# Patient Record
Sex: Male | Born: 1995 | Hispanic: Yes | Marital: Single | Smoking: Never smoker
Health system: Southern US, Community
[De-identification: ages and names within clinical notes are randomized; demographics above are authoritative.]

## PROBLEM LIST (undated history)

## (undated) DIAGNOSIS — J45990 Exercise induced bronchospasm: Secondary | ICD-10-CM

## (undated) DIAGNOSIS — J301 Allergic rhinitis due to pollen: Secondary | ICD-10-CM

## (undated) HISTORY — DX: Allergic rhinitis due to pollen: J30.1

## (undated) HISTORY — DX: Exercise induced bronchospasm: J45.990

---
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
## 2015-03-01 ENCOUNTER — Encounter: Payer: Self-pay | Admitting: Internal Medicine

## 2015-03-01 ENCOUNTER — Ambulatory Visit (INDEPENDENT_AMBULATORY_CARE_PROVIDER_SITE_OTHER): Payer: BLUE CROSS/BLUE SHIELD | Admitting: Internal Medicine

## 2015-03-01 ENCOUNTER — Encounter (INDEPENDENT_AMBULATORY_CARE_PROVIDER_SITE_OTHER): Payer: Self-pay

## 2015-03-01 VITALS — BP 110/80 | HR 97 | Temp 98.0°F | Ht 66.0 in | Wt 182.0 lb

## 2015-03-01 DIAGNOSIS — J301 Allergic rhinitis due to pollen: Secondary | ICD-10-CM

## 2015-03-01 DIAGNOSIS — Z0000 Encounter for general adult medical examination without abnormal findings: Secondary | ICD-10-CM | POA: Diagnosis not present

## 2015-03-01 DIAGNOSIS — J45990 Exercise induced bronchospasm: Secondary | ICD-10-CM | POA: Diagnosis not present

## 2015-03-01 MED ORDER — ALBUTEROL SULFATE HFA 108 (90 BASE) MCG/ACT IN AERS: 2.0000 | INHALATION_SPRAY | Freq: Three times a day (TID) | RESPIRATORY_TRACT | Status: AC | PRN

## 2015-03-01 NOTE — Progress Notes (Signed)
Subjective:    Patient ID: Peter Crawford, male    DOB: 09/19/1995, 20 y.o.   MRN: [id number]  HPI Here for physical  Has noticed a frequent cough--since "cold season" started Known exercise induced asthma Ran out of inhaler--feels he needs it again with soccer again Some SOB--not sure if he is just out of shape Some wheezing after running for a while  Still at [employer] and spanish Lives at home   No current outpatient prescriptions on file prior to visit.   No current facility-administered medications on file prior to visit.    No Known Allergies  Past Medical History  Diagnosis Date  . Exercise-induced asthma     No past surgical history on file.  Family History  Problem Relation Age of Onset  . Diabetes Father   . Asthma Brother   . Cancer Maternal Grandmother     metastatic cancer  . Heart disease Neg Hx   . Hypertension Other   . Diabetes Paternal Grandfather     Social History   Social History  . Marital Status: Single    Spouse Name: N/A  . Number of Children: N/A  . Years of Education: N/A   Occupational History  . Not on file.   Social History Main Topics  . Smoking status: Never Smoker   . Smokeless tobacco: Never Used  . Alcohol Use: No  . Drug Use: No  . Sexual Activity: Not on file   Other Topics Concern  . 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        Keeps up with dentist  Eyes: Negative for visual disturbance.       No diplopia or unilateral vision loss  Respiratory: Positive for cough and shortness of breath. Negative for chest tightness.   Cardiovascular: Negative for chest pain, palpitations and leg swelling.  Gastrointestinal: Negative for nausea, vomiting,  abdominal pain, constipation and blood in stool.  Endocrine: Negative for polydipsia and polyuria.  Genitourinary: Negative for dysuria, urgency and difficulty urinating.  Musculoskeletal: Negative for back pain and joint swelling.       Shoulder is better   Skin: Negative for rash.  Allergic/Immunologic: Positive for environmental allergies. Negative for immunocompromised state.       Uses allegra or mucinex May trigger asthma in spring  Neurological: Negative for dizziness, syncope, weakness, light-headedness and headaches.  Hematological: Negative for adenopathy. Does not bruise/bleed easily.  Psychiatric/Behavioral: Negative for sleep disturbance and dysphoric mood. The patient is not nervous/anxious.        Upset about GM with cancer and declining status       Objective:   Physical Exam  Constitutional: He is oriented to person, place, and time. He appears well-developed and well-nourished. No distress.  HENT:  Head: Normocephalic and atraumatic.  Right Ear: External ear normal.  Left Ear: External ear normal.  Mouth/Throat: Oropharynx is clear and moist. No oropharyngeal exudate.  Eyes: Conjunctivae and EOM are normal. Pupils are equal, round, and reactive to light.  Neck: Normal range of motion. Neck supple. No thyromegaly present.  Cardiovascular: Normal rate, regular rhythm, normal heart sounds and intact distal pulses.  Exam reveals no gallop.   No murmur heard. Pulmonary/Chest:  Effort normal. No respiratory distress. He has no wheezes. He has no rales.  Slight squeaks in right lung but not tight or wheezy  Abdominal: Soft. He exhibits no distension. There is no tenderness. There is no rebound and no guarding.  Genitourinary:  Tanner 5 Normal testes  Musculoskeletal: He exhibits no edema or tenderness.  Lymphadenopathy:    He has no cervical adenopathy.  Neurological: He is alert and oriented to person, place, and time.  Skin: No erythema.  Psychiatric: He has a  normal mood and affect. His behavior is normal.          Assessment & Plan:

## 2015-03-01 NOTE — Progress Notes (Signed)
Pre visit review using our clinic review tool, if applicable. No additional management support is needed unless otherwise documented below in the visit note. 

## 2015-03-01 NOTE — Assessment & Plan Note (Signed)
May want to consider montelukast if worsens in season

## 2015-03-01 NOTE — Assessment & Plan Note (Signed)
Healthy but out of shape Working on fitness Counseled on safety in car, substances (no tobacco or alcohol), safe sex Prefers no HIV testing

## 2015-03-01 NOTE — Assessment & Plan Note (Signed)
Will Rx inhaler

## 2015-04-02 ENCOUNTER — Other Ambulatory Visit: Payer: Self-pay | Admitting: Family Medicine

## 2015-04-02 MED ORDER — OSELTAMIVIR PHOSPHATE 75 MG PO CAPS: 75.0000 mg | ORAL_CAPSULE | Freq: Every day | ORAL | Status: DC

## 2016-03-12 ENCOUNTER — Encounter: Payer: Self-pay | Admitting: Family Medicine

## 2016-03-12 ENCOUNTER — Ambulatory Visit (INDEPENDENT_AMBULATORY_CARE_PROVIDER_SITE_OTHER): Payer: BLUE CROSS/BLUE SHIELD | Admitting: Family Medicine

## 2016-03-12 ENCOUNTER — Encounter (INDEPENDENT_AMBULATORY_CARE_PROVIDER_SITE_OTHER): Payer: Self-pay

## 2016-03-12 VITALS — BP 90/60 | HR 112 | Temp 98.9°F | Wt 178.0 lb

## 2016-03-12 DIAGNOSIS — R8290 Unspecified abnormal findings in urine: Secondary | ICD-10-CM | POA: Diagnosis not present

## 2016-03-12 DIAGNOSIS — K529 Noninfective gastroenteritis and colitis, unspecified: Secondary | ICD-10-CM

## 2016-03-12 DIAGNOSIS — K5289 Other specified noninfective gastroenteritis and colitis: Secondary | ICD-10-CM | POA: Diagnosis not present

## 2016-03-12 NOTE — Progress Notes (Signed)
Recently with sx.  Started last night.  Felt nauseated.  Then vomiting and  progressive diarrhea.  Went to [facility] this AM.  Occ lightheaded on standing.  Still making urine, but darker than normal.  Fever this AM.  Prev with aches.    [facility]  D/w pt.  No burning with urination.  No h/o renal stones.    He feels better after taking zofran and taking imodium.    Meds, vitals, and allergies reviewed.   ROS: Per HPI unless specifically indicated in ROS section   GEN: nad, alert and oriented HEENT: mucous membranes moist NECK: supple w/o LA CV: rrr. PULM: ctab, no inc wob ABD: soft, +bs, no rebound EXT: no edema

## 2016-03-12 NOTE — Progress Notes (Signed)
Pre visit review using our clinic review tool, if applicable. No additional management support is needed unless otherwise documented below in the visit note. 

## 2016-03-12 NOTE — Patient Instructions (Signed)
Rest and clear fluids, should gradually resolve.  Update us as needed.  Get up slowly- your BP was a little low.  Take care.  Glad to see you.

## 2016-03-13 ENCOUNTER — Telehealth: Payer: Self-pay | Admitting: Family Medicine

## 2016-03-13 DIAGNOSIS — K529 Noninfective gastroenteritis and colitis, unspecified: Secondary | ICD-10-CM | POA: Insufficient documentation

## 2016-03-13 NOTE — Telephone Encounter (Signed)
Spoke to pt

## 2016-03-13 NOTE — Assessment & Plan Note (Signed)
Presumed norovirus.  Rest and fluids.  Avoid dairy.  zofran if needed.   Okay for outpatient f/u.  Routine cautions given to patient.   He had abnormal u/a at [facility] this AM, unclear if that was related to relative dehydration vs false positive.  D/w pt.  Would recheck u/a after he is well, unless PCP has other preferences- routed phone note to PCP for input.  No dysuria.

## 2016-03-13 NOTE — Telephone Encounter (Signed)
Please let him know that the abnormalities appear to be from dehydration and UTI very unlikely

## 2016-03-13 NOTE — Telephone Encounter (Signed)
  [facility] unclear if that was related to relative dehydration vs false positive.  UC is culturing his urine.  I would recheck u/a after he is well, unless you have other preferences.   No dysuria.  Let me know.  Thanks.

## 2016-06-06 ENCOUNTER — Ambulatory Visit (INDEPENDENT_AMBULATORY_CARE_PROVIDER_SITE_OTHER): Payer: BLUE CROSS/BLUE SHIELD | Admitting: Family Medicine

## 2016-06-06 ENCOUNTER — Encounter: Payer: Self-pay | Admitting: Family Medicine

## 2016-06-06 DIAGNOSIS — J4521 Mild intermittent asthma with (acute) exacerbation: Secondary | ICD-10-CM

## 2016-06-06 DIAGNOSIS — IMO0001 Reserved for inherently not codable concepts without codable children: Secondary | ICD-10-CM

## 2016-06-06 MED ORDER — BECLOMETHASONE DIPROPIONATE 80 MCG/ACT IN AERS: 2.0000 | INHALATION_SPRAY | Freq: Two times a day (BID) | RESPIRATORY_TRACT | 12 refills | Status: DC

## 2016-06-06 MED ORDER — MONTELUKAST SODIUM 10 MG PO TABS: 10.0000 mg | ORAL_TABLET | Freq: Every day | ORAL | 3 refills | Status: DC

## 2016-06-06 NOTE — Patient Instructions (Signed)
Associated with allergies.  Start singulair in addition to [employer].  Start inhaled corticosteroid , QVAR moderate dose. Follow up in 1 months to reassess control.

## 2016-06-06 NOTE — Assessment & Plan Note (Signed)
Associated with allergies.  Start singulair in addition to [employer].  Start inhaled corticosteroid , QVAR moderate dose. Follow up in 1 months to reassess control.   No indication to prednisone today given pealk flow 500.

## 2016-06-06 NOTE — Progress Notes (Signed)
   Subjective:    Patient ID: Peter Crawford, male    DOB: 1995-12-13, 21 y.o.   MRN: [id number]  HPI   21 year old male pt of Dr. Karle Starch presents for allergies and asthma.  ASTHMA , previously exercise induced in past Symptoms are well controlled:NO , worsened control in last month  Using medications : Using albuterol every 8-12 hours for symptoms Night time symptoms: yes, cough Wheeze/SOB: yes when outside.. Chest tightness, SOB Has had asthma attack in last week.  dry cough ER visits since last visit: none Missed work or school: none Allergens:   Has runny nose, itchy eye, irritatted eyes  Currently using allegra D once daily.   Review of Systems  Constitutional: Positive for fatigue.  HENT: Negative for ear pain.   Eyes: Negative for pain.  Respiratory: Positive for cough, chest tightness, shortness of breath and wheezing.   Cardiovascular: Negative for chest pain, palpitations and leg swelling.  Gastrointestinal: Negative for abdominal distention.       Objective:   Physical Exam  Constitutional: Vital signs are normal. He appears well-developed and well-nourished.  Non-toxic appearance. He does not appear ill. No distress.  HENT:  Head: Normocephalic and atraumatic.  Right Ear: Hearing, tympanic membrane, external ear and ear canal normal. No tenderness. No foreign bodies. Tympanic membrane is not retracted and not bulging.  Left Ear: Hearing, tympanic membrane, external ear and ear canal normal. No tenderness. No foreign bodies. Tympanic membrane is not retracted and not bulging.  Nose: Mucosal edema and rhinorrhea present. Right sinus exhibits no maxillary sinus tenderness and no frontal sinus tenderness. Left sinus exhibits no maxillary sinus tenderness and no frontal sinus tenderness.  Mouth/Throat: Uvula is midline, oropharynx is clear and moist and mucous membranes are normal. Normal dentition. No dental caries. No oropharyngeal exudate or tonsillar abscesses.    Eyes: Conjunctivae, EOM and lids are normal. Pupils are equal, round, and reactive to light. Lids are everted and swept, no foreign bodies found.  Neck: Trachea normal, normal range of motion and phonation normal. Neck supple. Carotid bruit is not present. No thyroid mass and no thyromegaly present.  Cardiovascular: Normal rate, regular rhythm, S1 normal, S2 normal, normal heart sounds, intact distal pulses and normal pulses.  Exam reveals no gallop.   No murmur heard. Pulmonary/Chest: Effort normal. No respiratory distress. He has wheezes. He has no rhonchi. He has no rales.  Abdominal: Soft. Normal appearance and bowel sounds are normal. There is no hepatosplenomegaly. There is no tenderness. There is no rebound, no guarding and no CVA tenderness. No hernia.  Neurological: He is alert. He has normal reflexes.  Skin: Skin is warm, dry and intact. No rash noted.  Psychiatric: He has a normal mood and affect. His speech is normal and behavior is normal. Judgment normal.          Assessment & Plan:

## 2016-06-06 NOTE — Progress Notes (Signed)
Pre visit review using our clinic review tool, if applicable. No additional management support is needed unless otherwise documented below in the visit note. 

## 2016-08-12 ENCOUNTER — Ambulatory Visit (INDEPENDENT_AMBULATORY_CARE_PROVIDER_SITE_OTHER): Payer: BLUE CROSS/BLUE SHIELD | Admitting: Primary Care

## 2016-08-12 ENCOUNTER — Ambulatory Visit (INDEPENDENT_AMBULATORY_CARE_PROVIDER_SITE_OTHER)
Admission: RE | Admit: 2016-08-12 | Discharge: 2016-08-12 | Disposition: A | Discharge status: Home / Self Care | Payer: BLUE CROSS/BLUE SHIELD | Source: Ambulatory Visit | Attending: Primary Care | Admitting: Primary Care

## 2016-08-12 VITALS — BP 122/82 | HR 66 | Temp 98.4°F | Wt 182.8 lb

## 2016-08-12 DIAGNOSIS — M25531 Pain in right wrist: Secondary | ICD-10-CM

## 2016-08-12 DIAGNOSIS — R29898 Other symptoms and signs involving the musculoskeletal system: Secondary | ICD-10-CM | POA: Diagnosis not present

## 2016-08-12 NOTE — Patient Instructions (Addendum)
Complete xray(s) prior to leaving today.   Start Ibuprofen 600 mg three times daily for pain and inflammation.  Continue wearing the brace for support as discussed.  I'll be in touch later today or tomorrow.  It was a pleasure meeting you!

## 2016-08-12 NOTE — Progress Notes (Signed)
Subjective:    Patient ID: Peter Crawford, male    DOB: 1995-11-24, 21 y.o.   MRN: [id number]  HPI  Peter Crawford is a 21 year old male who presents today with a chief complaint of wrist pain. His pain began after injury 1 week ago. He was playing soccer, slipped with another player, fell onto an outstretched hand. He suddenly felt a "pop" and noticed swelling with limited range of movement and pain. Since the injury he's continued to notice pain with various ranges of motion and when lifting objects. He notices intermittent swelling. He's been wearing the wrist splint during the day and at night. He's not taken anything OTC for his symptoms.   Review of Systems  Constitutional: Negative for fever.  Musculoskeletal: Positive for arthralgias and joint swelling.  Skin: Negative for color change.  Neurological: Negative for numbness.       Past Medical History:  Diagnosis Date  . Allergic rhinitis due to pollen    can trigger asthma  . Exercise-induced asthma      Social History   Social History  . Marital status: Single    Spouse name: N/A  . Number of children: N/A  . Years of education: N/A   Occupational History  . Not on file.   Social History Main Topics  . Smoking status: Never Smoker  . Smokeless tobacco: Never Used  . Alcohol use No  . Drug use: No  . Sexual activity: Not on file   Other Topics Concern  . 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  Family History  Problem Relation Age of Onset  . Diabetes Father   . Asthma Brother   . Cancer Maternal Grandmother        metastatic cancer  . Heart disease Neg Hx   . Hypertension Other   . Diabetes Paternal Grandfather     No Known Allergies  Current Outpatient Prescriptions on File Prior to Visit  Medication Sig Dispense Refill  . albuterol (PROVENTIL HFA;VENTOLIN HFA) 108 (90 Base)  MCG/ACT inhaler Inhale 2 puffs into the lungs 3 (three) times daily as needed for wheezing or shortness of breath. Use with spacer 18 g 3  . beclomethasone (QVAR) 80 MCG/ACT inhaler Inhale 2 puffs into the lungs 2 (two) times daily. 1 Inhaler 12  . fexofenadine-pseudoephedrine (ALLEGRA-D 24) 180-240 MG 24 hr tablet Take 1 tablet by mouth daily.    . montelukast (SINGULAIR) 10 MG tablet Take 1 tablet (10 mg total) by mouth at bedtime. 30 tablet 3   No current facility-administered medications on file prior to visit.     BP 122/82   Pulse 66   Temp 98.4 F (36.9 C) (Oral)   Wt 182 lb 12.8 oz (82.9 kg)   SpO2 98%   BMI 29.06 kg/m    Objective:   Physical Exam  Constitutional: He appears well-nourished.  Neck: Neck supple.  Cardiovascular: Normal rate.   Pulmonary/Chest: Effort normal.  Musculoskeletal:       Right wrist: He exhibits decreased range of motion, tenderness, bony tenderness and swelling. He exhibits no deformity.  Skin: Skin is warm and dry. No erythema.          Assessment & Plan:  Acute Wrist Pain:  Located to right wrist x 1 week ago after FOOSH. Exam today with mild swelling, decrease in ROM, and  tenderness. Xray of wrist pending today.  Start Ibuprofen 600 mg TID for pain and inflammation. Discussed to continue wearing the brace for support during the day and when sleeping. May need ortho evaluation if fractured.  Morrie Sheldonlark,Ebonique Hallstrom Kendal, NP

## 2016-11-04 IMAGING — CR DG SHOULDER 2+V*R*
3 series · 3 of 3 positions shown · non-contrast
Comparison: None.

CLINICAL DATA: Intermittent right shoulder pain.

EXAM:
RIGHT SHOULDER - 2+ VIEW

[view not recorded (1 of 3)]
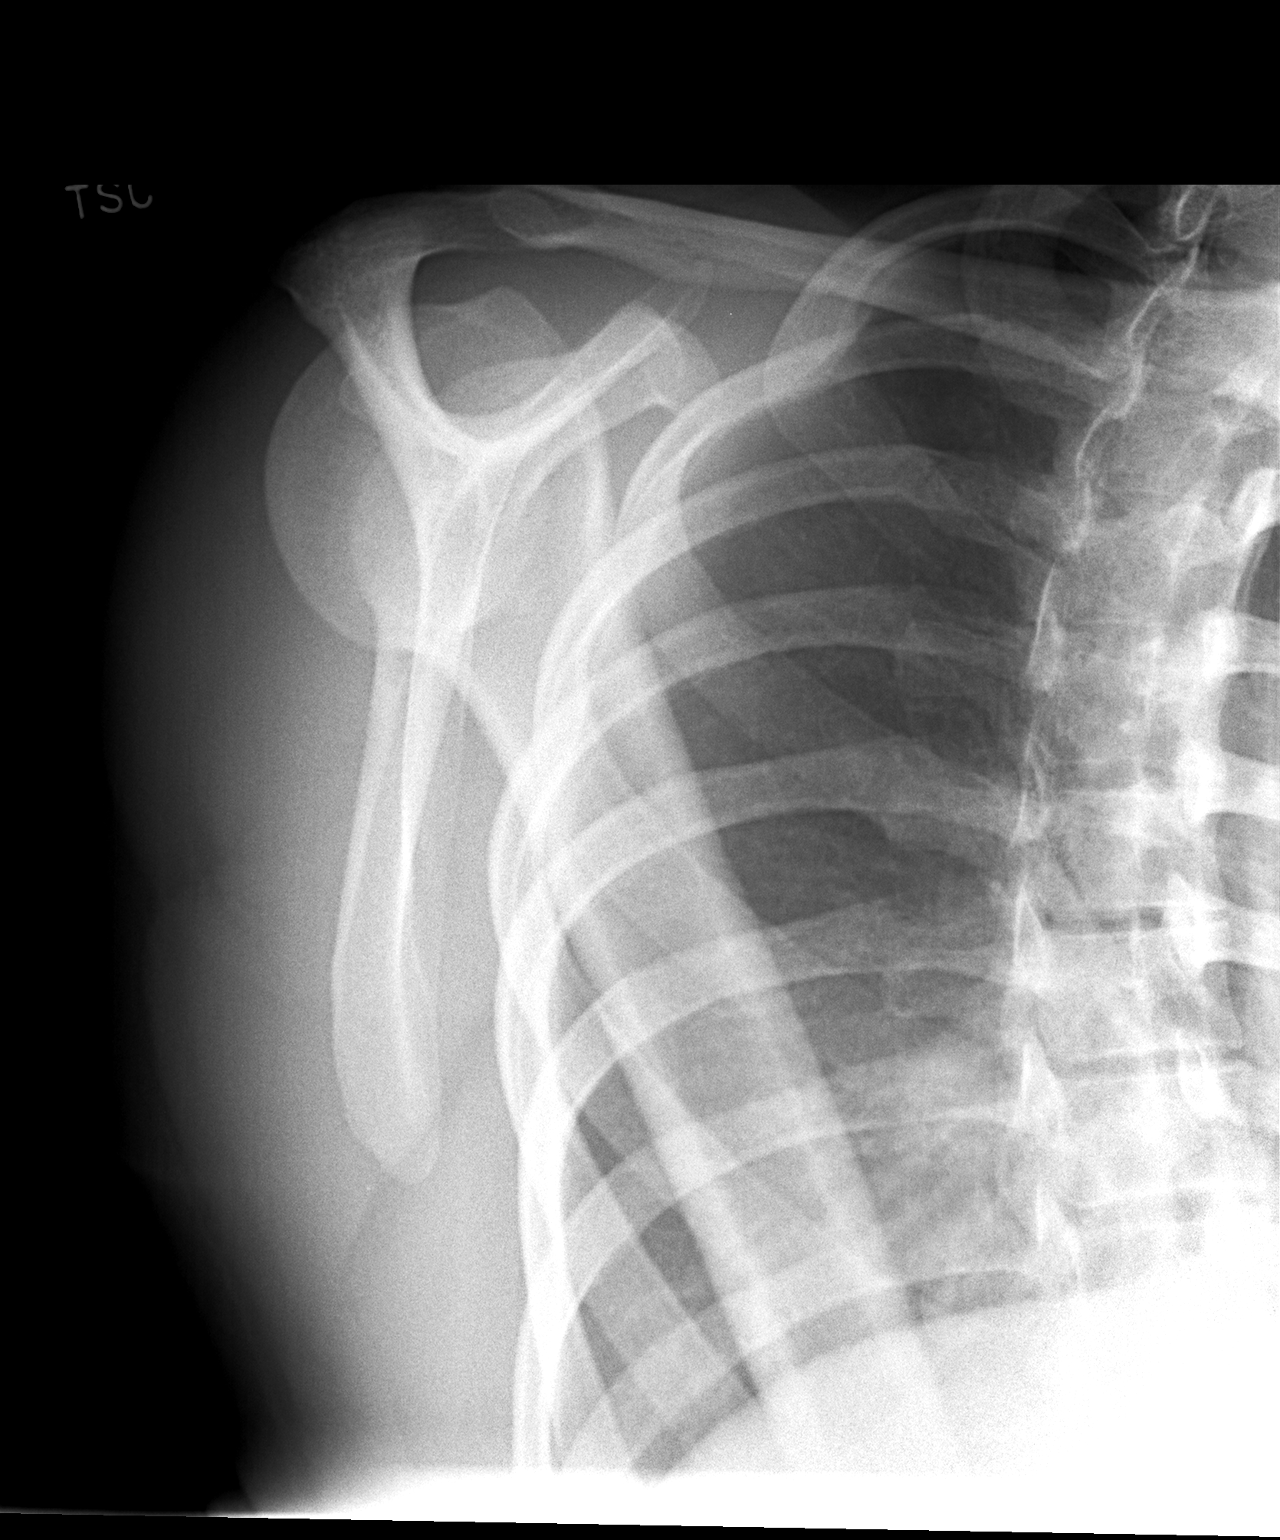

[view not recorded (2 of 3)]
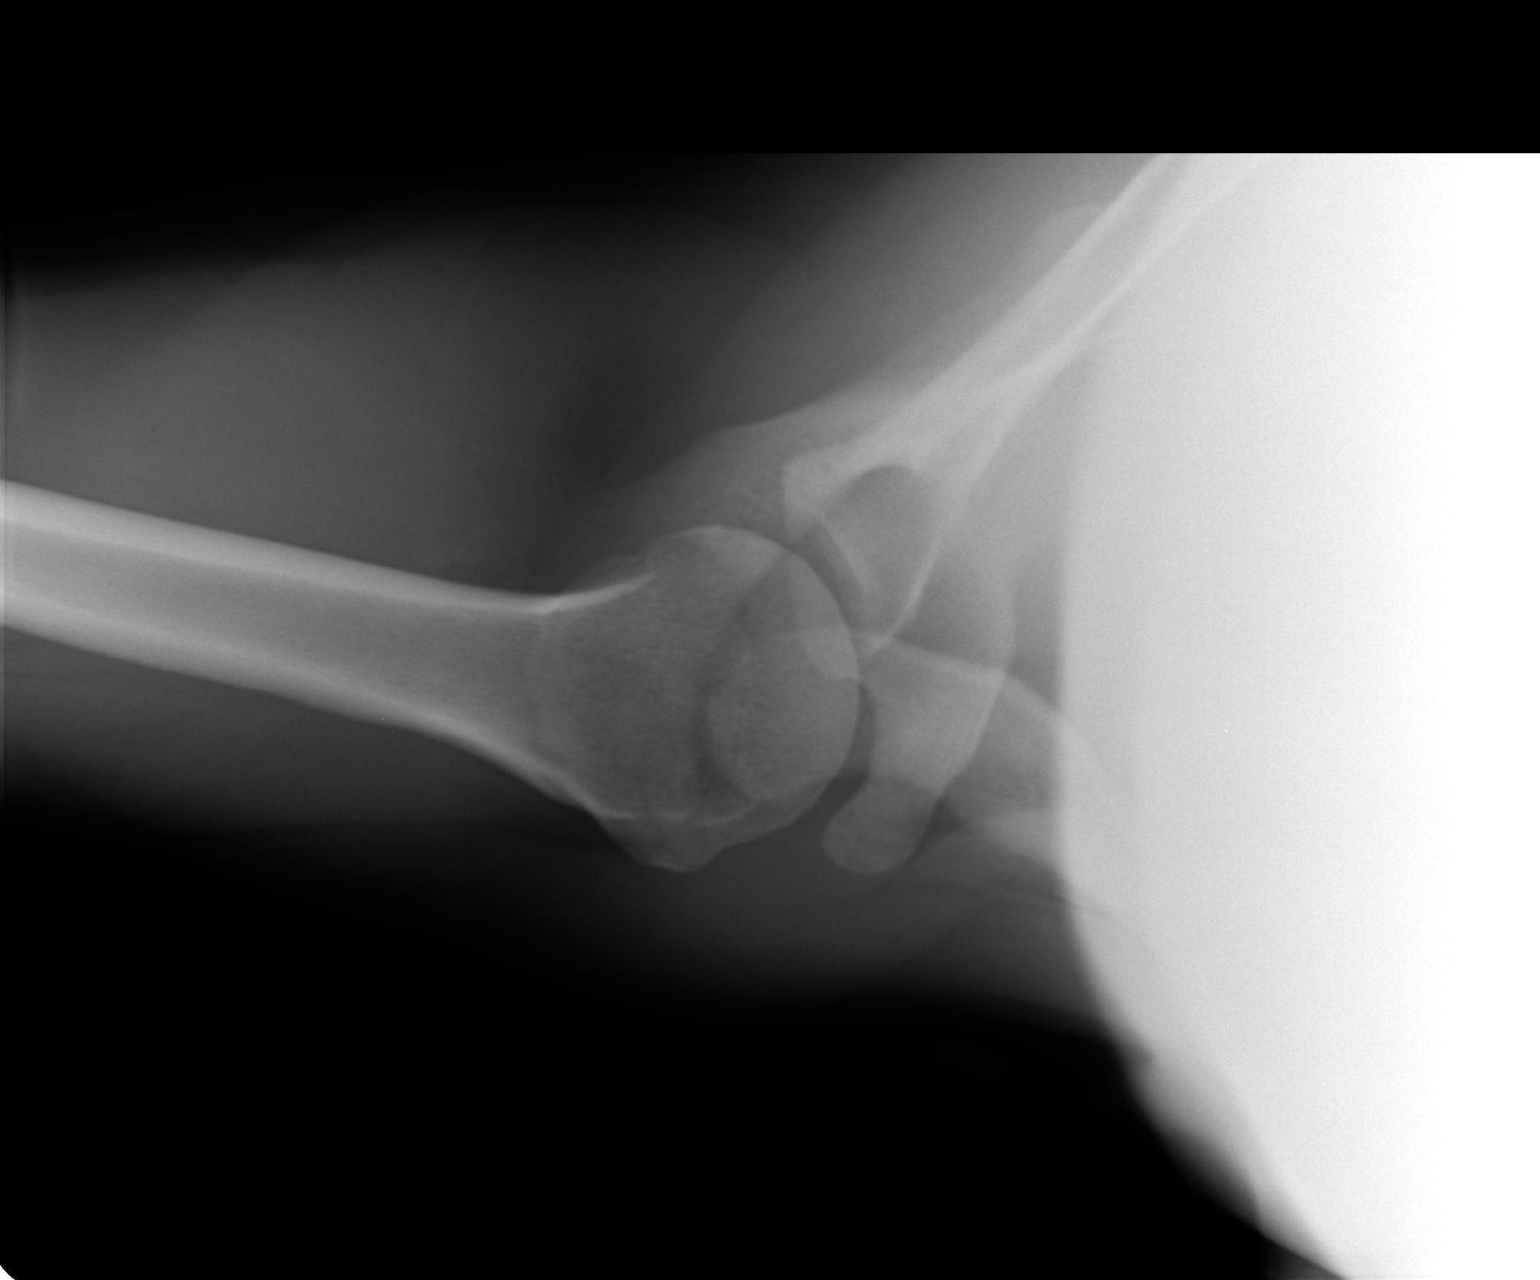

[view not recorded (3 of 3)]
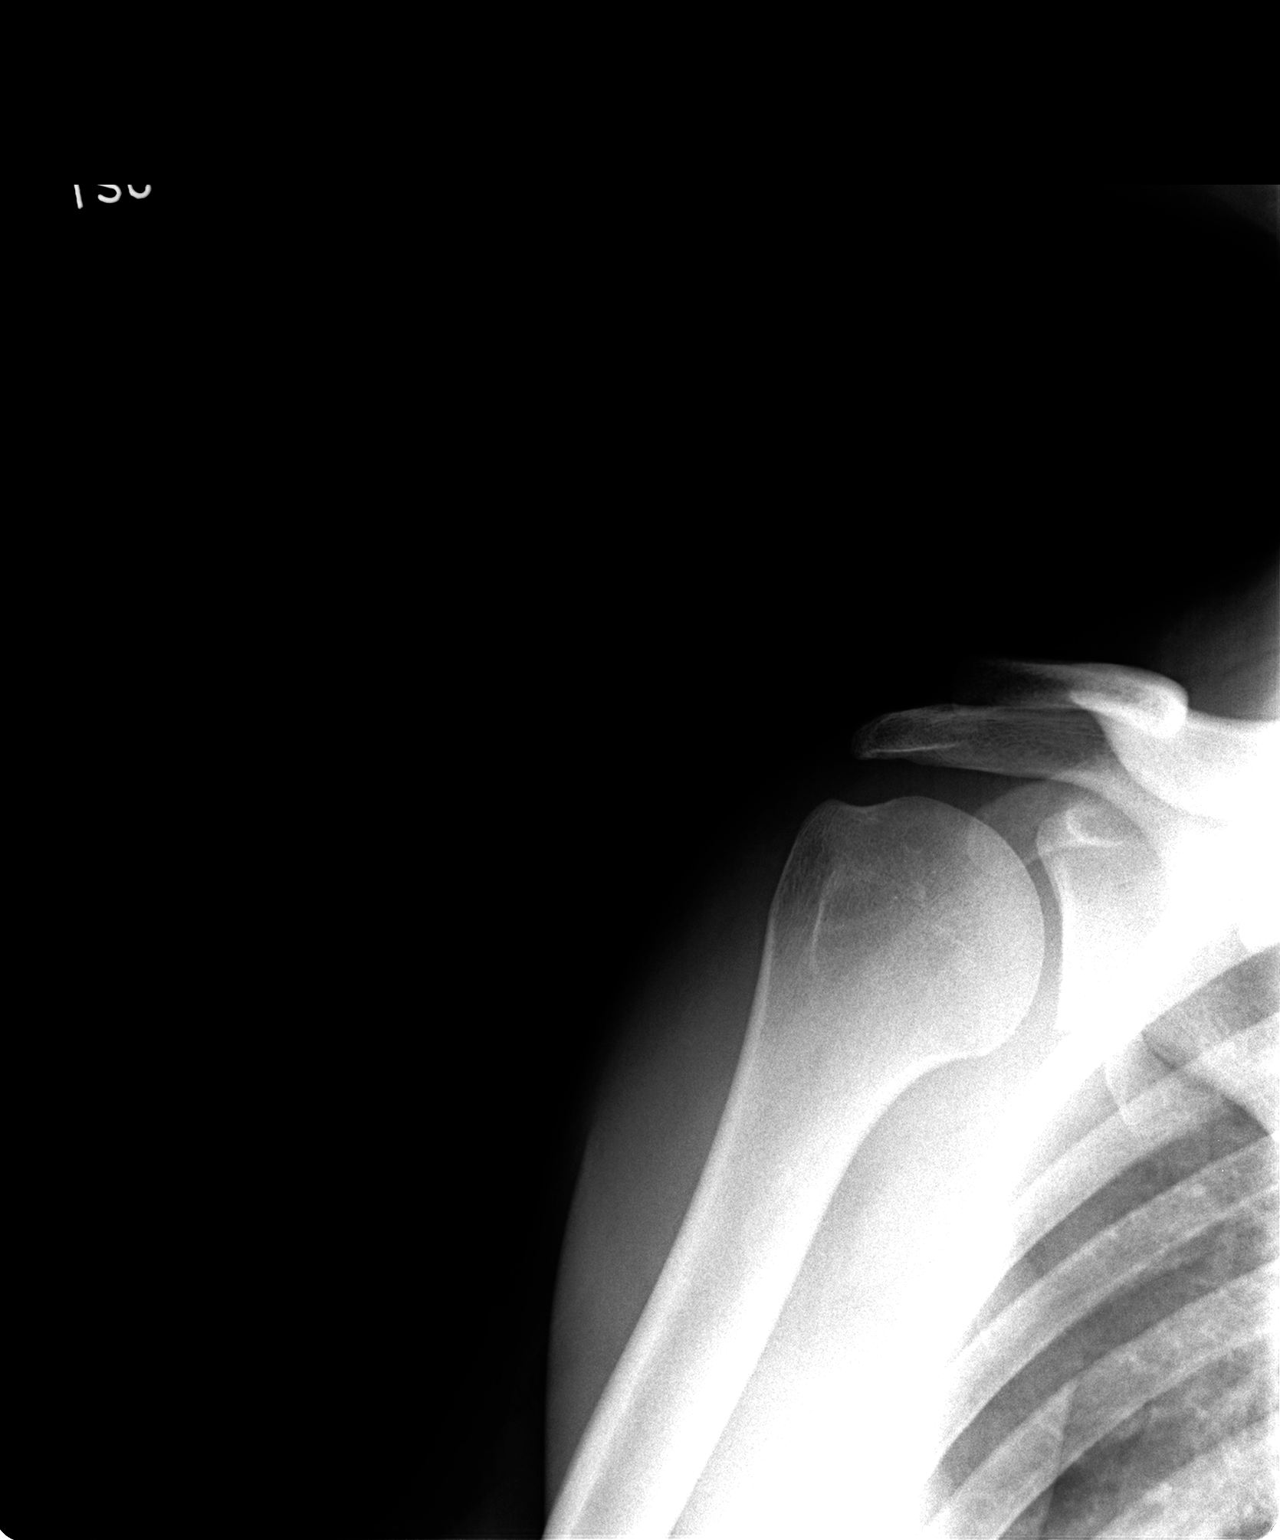

[3 of 3 positions shown; findings below may reference images not displayed]

FINDINGS: There is no evidence of fracture or dislocation. There is no
evidence of arthropathy or other focal bone abnormality. Soft
tissues are unremarkable.
IMPRESSION: Negative.

## 2016-11-11 ENCOUNTER — Telehealth: Payer: Self-pay | Admitting: Internal Medicine

## 2016-11-11 NOTE — Telephone Encounter (Signed)
Spoken and notified patient of Kate's comments. Patient verbalized understanding.  Patient will see Dr Alphonsus Sias on 11/14/2016

## 2016-11-11 NOTE — Telephone Encounter (Signed)
PT was seen 6/26 for wrist pain. He is still experiencing lots of pain in wrist, has got worse and is hard to move hand now. He is wanting an [place] or MRI to check it further. Please call with any questions.

## 2016-11-11 NOTE — Telephone Encounter (Signed)
Please notify patient that I'm happy to help, please schedule him for an office visit at his convenience.

## 2016-11-14 ENCOUNTER — Telehealth: Payer: Self-pay | Admitting: Internal Medicine

## 2016-11-14 ENCOUNTER — Ambulatory Visit: Payer: BLUE CROSS/BLUE SHIELD | Admitting: Internal Medicine

## 2016-11-14 NOTE — Telephone Encounter (Signed)
Patient called about his 7:15 appointment today.  Patient said he didn't think that we were open that early and that he thought there was a mistake with the appointment.  Patient rescheduled his appointment to 11/20/16 with Jae Dire. Can I cancel today's appointment?

## 2016-11-14 NOTE — Telephone Encounter (Signed)
yes

## 2016-11-14 NOTE — Telephone Encounter (Signed)
Appointment cancelled

## 2016-11-20 ENCOUNTER — Ambulatory Visit (INDEPENDENT_AMBULATORY_CARE_PROVIDER_SITE_OTHER): Payer: BLUE CROSS/BLUE SHIELD | Admitting: Primary Care

## 2016-11-20 ENCOUNTER — Encounter: Payer: Self-pay | Admitting: Primary Care

## 2016-11-20 VITALS — BP 122/74 | HR 84 | Temp 97.6°F | Ht 67.0 in | Wt 185.0 lb

## 2016-11-20 DIAGNOSIS — M25531 Pain in right wrist: Secondary | ICD-10-CM

## 2016-11-20 DIAGNOSIS — G8929 Other chronic pain: Secondary | ICD-10-CM

## 2016-11-20 MED ORDER — NAPROXEN 500 MG PO TABS: 500.0000 mg | ORAL_TABLET | Freq: Two times a day (BID) | ORAL | 0 refills | Status: DC

## 2016-11-20 NOTE — Patient Instructions (Signed)
Start naproxen 500 mg tablets. Take 1 tablet by mouth twice daily with food for the next 7-10 days. Do not take any ibuprofen.  Wear the brace at night if possible.  Schedule an appointment with Dr. Patsy Lager up front.  It was a pleasure to see you today!

## 2016-11-20 NOTE — Progress Notes (Signed)
Subjective:    Patient ID: Peter Crawford, male    DOB: 12/17/1995, 21 y.o.   MRN: [id number]  HPI  Peter Crawford is a 21 year old male who presents today with a chief complaint of wrist pain. His pain is located to the right lateral wrist that has been present since late June 2018 after a soccer injury. He was evaluated in late June 2018, xray was negative. He was encouraged to use NSAID's, wear a brace for support, and rest. He felt slightly improved after wearing the brace as instructed but the started feeling worse with the brace so he stopped wearing it.   He endorses weakness, intermittent numbness/tingling to the wrist, "catching" of his thumb. He's been taking tylenol intermittently. He denies recent re-injury, radiation of pain to his forearm or fingers, tingling/numbness of fingers. 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   Review of Systems  Musculoskeletal:       Right lateral wrist pain  Skin: Negative for color change.  Neurological: Positive for numbness.       Past Medical History:  Diagnosis Date  . Allergic rhinitis due to pollen    can trigger asthma  . Exercise-induced asthma      Social History   Social History  . Marital status: Single    Spouse name: N/A  . Number of children: N/A  . Years of education: N/A   Occupational History  . Not on file.   Social History Main Topics  . Smoking status: Never Smoker  . Smokeless tobacco: Never Used  . Alcohol use No  . Drug use: No  . Sexual activity: Not on file   Other Topics Concern  . 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  Family History  Problem Relation Age of Onset  . Diabetes Father   . Asthma Brother   . Cancer Maternal Grandmother        metastatic cancer  . Heart disease Neg Hx   . Hypertension Other   . Diabetes Paternal Grandfather     No Known  Allergies  Current Outpatient Prescriptions on File Prior to Visit  Medication Sig Dispense Refill  . albuterol (PROVENTIL HFA;VENTOLIN HFA) 108 (90 Base) MCG/ACT inhaler Inhale 2 puffs into the lungs 3 (three) times daily as needed for wheezing or shortness of breath. Use with spacer 18 g 3  . beclomethasone (QVAR) 80 MCG/ACT inhaler Inhale 2 puffs into the lungs 2 (two) times daily. 1 Inhaler 12  . fexofenadine-pseudoephedrine (ALLEGRA-D 24) 180-240 MG 24 hr tablet Take 1 tablet by mouth daily.    . montelukast (SINGULAIR) 10 MG tablet Take 1 tablet (10 mg total) by mouth at bedtime. 30 tablet 3   No current facility-administered medications on file prior to visit.     BP 122/74 (BP Location: Left Arm, Patient Position: Sitting)   Pulse 84   Temp 97.6 F (36.4 C) (Oral)   Ht  (1.702 m)   Wt 185 lb (83.9 kg)   SpO2 98%   BMI 28.98 kg/m    Objective:   Physical Exam  Constitutional: He appears well-nourished.  Cardiovascular: Normal rate.   Pulses:      Radial pulses are 2+ on the right side.  Pulmonary/Chest: Effort normal.  Musculoskeletal:       Right wrist: He exhibits decreased range  of motion. He exhibits no tenderness and no bony tenderness.  Decrease in ROM with pain with wrist flexion. Strength 5/5 bilaterally.   Skin: Skin is warm and dry. No erythema.          Assessment & Plan:  Chronic Wrist Pain:  Since late June 2018 after soccer injury. Xray negative. Symptoms and examination concerning for tendon involvement, potential tear vs chronic inflammation. Consider carpal tunnel, but symptoms don't quite resemble. Rx for Naproxen 10 day course sent to pharmacy.  Will have him wear the brace at night.  Will have him schedule an appointment with Sports Medicine.   Peter Sheldon, NP

## 2016-11-27 ENCOUNTER — Encounter: Payer: Self-pay | Admitting: Family Medicine

## 2016-11-27 ENCOUNTER — Ambulatory Visit (INDEPENDENT_AMBULATORY_CARE_PROVIDER_SITE_OTHER): Payer: BLUE CROSS/BLUE SHIELD | Admitting: Family Medicine

## 2016-11-27 VITALS — BP 100/70 | HR 77 | Temp 97.7°F | Ht 67.0 in | Wt 186.5 lb

## 2016-11-27 DIAGNOSIS — M25531 Pain in right wrist: Secondary | ICD-10-CM | POA: Diagnosis not present

## 2016-11-27 NOTE — Progress Notes (Signed)
Dr. Karleen Hampshire T. Monifah Freehling, MD, CAQ [facility] [address] [place], [address] Phone: [phone] Fax: [phone]  11/27/2016  Patient: Peter Crawford, MRN: [id number], DOB: January 09, 1996, 21 y.o.  Primary Physician:  Karie Schwalbe, MD   Chief Complaint  Patient presents with  . Wrist Pain    Right   Subjective:   Peter Crawford is a 22 y.o. very pleasant male patient who presents with the following:   6 mo wrist pain. Started hurting more and more. Gave some time with a brace, felt a little bit better. Has been flaring up. At that point he fell on outstretched hand, and came to our office and was treated with a wrist plan. After he gave his some time, he has been having some improvement of the wrist, but he is having significant pain and impairment of the use of his right hand. He is left-hand dominant. He is having significant impairment of use of his left thumb and hand as well as the wrist.  Axial load hurts.  Lourena Simmonds + Weak ok sign  Past Medical History, Surgical History, Social History, Family History, Problem List, Medications, and Allergies have been reviewed and updated if relevant.  Patient Active Problem List   Diagnosis Date Noted  . Gastroenteritis 03/13/2016  . Allergic rhinitis due to pollen   . Preventative health care 09/15/2012  . Moderate intermittent asthma with acute exacerbation     Past Medical History:  Diagnosis Date  . Allergic rhinitis due to pollen    can trigger asthma  . Exercise-induced asthma     No past surgical history on file.  Social History   Social History  . Marital status: Single    Spouse name: N/A  . Number of children: N/A  . Years of education: N/A   Occupational History  . Not on file.   Social History Main Topics  . Smoking status: Never Smoker  . Smokeless tobacco: Never Used  . Alcohol use No  . Drug use: No  . Sexual activity: Not on file   Other Topics  Concern  . 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 Diabetes Father   . Asthma Brother   . Cancer Maternal Grandmother        metastatic cancer  . Heart disease Neg Hx   . Hypertension Other   . Diabetes Paternal Grandfather     No Known Allergies  Medication list reviewed and updated in full in  Link.  GEN: No fevers, chills. Nontoxic. Primarily MSK c/o today. MSK: Detailed in the HPI GI: tolerating PO intake without difficulty Neuro: No numbness, parasthesias, or tingling associated. Otherwise the pertinent positives of the ROS are noted above.   Objective:   BP 100/70   Pulse 77   Temp 97.7 F (36.5 C) (Oral)   Ht  (1.702 m)   Wt 186 lb 8 oz (84.6 kg)   BMI 29.21 kg/m    GEN: WDWN, NAD, Non-toxic, Alert & Oriented x 3 HEENT: Atraumatic, Normocephalic.  Ears and Nose: No external deformity. EXTR: No clubbing/cyanosis/edema NEURO: Normal gait.  PSYCH: Normally interactive. Conversant. Not depressed or anxious appearing.  Calm demeanor.    Right wrist: Pain with axial loading. No significant pain with radial and ulnar deviation. Weak okay sign compared to  the left side. Diminished ability to lift at the thumb. Finkelstein's test is positive. Nontender in the anatomical snuff box. The hamate is negative. All metacarpals and fingers are nontender. The ulnar collateral ligament is intact. Neurovascularly intact.  Radiology: Dg Wrist Complete Right  Result Date: 08/12/2016 CLINICAL DATA:  Fall 2 weeks ago.  Limited range of motion. EXAM: RIGHT WRIST - COMPLETE 3+ VIEW COMPARISON:  None. FINDINGS: No acute fracture. No dislocation.  Unremarkable soft tissues. IMPRESSION: No acute bony pathology. Electronically Signed   By: Jolaine Click M.D.   On: 08/12/2016 09:24   Assessment and Plan:   Right wrist pain - Plan: MR  WRIST RIGHT W CONTRAST, DG FLUORO GUIDED NEEDLE PLC ASPIRATION/INJECTION LOC  Almost 6 months of conservative treatment of the right wrist with ongoing fairly dramatic impairment of the right hand and wrist. Obtain an MR arthrogram of the right wrist to evaluate for ligamentous disruption in the wrist, occult fracture, avascular necrosis, or other unforeseen wrist and hand pathology.  Follow-up: No Follow-up on file.  Future Appointments Date Time Provider Department Center  12/10/2016 2:45 PM [facility] DG C[facility] RM 3 [facility]  12/10/2016 3:15 PM [facility] MR 1 [facility]   Orders Placed This Encounter  Procedures  . MR WRIST RIGHT W CONTRAST  . DG FLUORO GUIDED NEEDLE PLC ASPIRATION/INJECTION LOC    Signed,  Lavone Barrientes T. Quinton Voth, MD   Allergies as of 11/27/2016   No Known Allergies     Medication List       Accurate as of 11/27/16  2:39 PM. Always use your most recent med list.          albuterol 108 (90 Base) MCG/ACT inhaler Commonly known as:  PROVENTIL HFA;VENTOLIN HFA Inhale 2 puffs into the lungs 3 (three) times daily as needed for wheezing or shortness of breath. Use with spacer   beclomethasone 80 MCG/ACT inhaler Commonly known as:  QVAR Inhale 2 puffs into the lungs 2 (two) times daily.   fexofenadine-pseudoephedrine 180-240 MG 24 hr tablet Commonly known as:  ALLEGRA-D 24 Take 1 tablet by mouth daily.   montelukast 10 MG tablet Commonly known as:  SINGULAIR Take 1 tablet (10 mg total) by mouth at bedtime.   naproxen 500 MG tablet Commonly known as:  NAPROSYN Take 1 tablet (500 mg total) by mouth 2 (two) times daily with a meal.

## 2016-11-27 NOTE — Patient Instructions (Signed)

## 2016-12-10 ENCOUNTER — Ambulatory Visit
Admission: RE | Admit: 2016-12-10 | Discharge: 2016-12-10 | Disposition: A | Discharge status: Home / Self Care | Payer: BLUE CROSS/BLUE SHIELD | Source: Ambulatory Visit | Attending: Family Medicine | Admitting: Family Medicine

## 2016-12-10 DIAGNOSIS — S63511A Sprain of carpal joint of right wrist, initial encounter: Secondary | ICD-10-CM | POA: Diagnosis not present

## 2016-12-10 DIAGNOSIS — M25531 Pain in right wrist: Secondary | ICD-10-CM

## 2016-12-10 MED ORDER — IOPAMIDOL (ISOVUE-M 200) INJECTION 41%
20.0000 mL | Freq: Once | INTRAMUSCULAR | Status: AC
  Administered 2016-12-10: 3 mL via INTRA_ARTICULAR

## 2016-12-18 ENCOUNTER — Other Ambulatory Visit: Payer: Self-pay | Admitting: Family Medicine

## 2016-12-18 DIAGNOSIS — S66901A Unspecified injury of unspecified muscle, fascia and tendon at wrist and hand level, right hand, initial encounter: Secondary | ICD-10-CM

## 2016-12-18 DIAGNOSIS — M79641 Pain in right hand: Secondary | ICD-10-CM

## 2016-12-18 DIAGNOSIS — S63591A Other specified sprain of right wrist, initial encounter: Secondary | ICD-10-CM

## 2016-12-30 DIAGNOSIS — M654 Radial styloid tenosynovitis [de Quervain]: Secondary | ICD-10-CM | POA: Diagnosis not present

## 2019-01-27 ENCOUNTER — Ambulatory Visit: Payer: Self-pay | Admitting: Internal Medicine

## 2019-03-01 IMAGING — XA DG FLUORO GUIDE NDL PLC/BX
2 series · 2 of 2 positions shown · non-contrast
Comparison: none

CLINICAL DATA: Right wrist pain after fall while playing soccer.

[Series 1: ortho standard · 1 of 1 slices shown (1 of 2)]
[im 1/1]
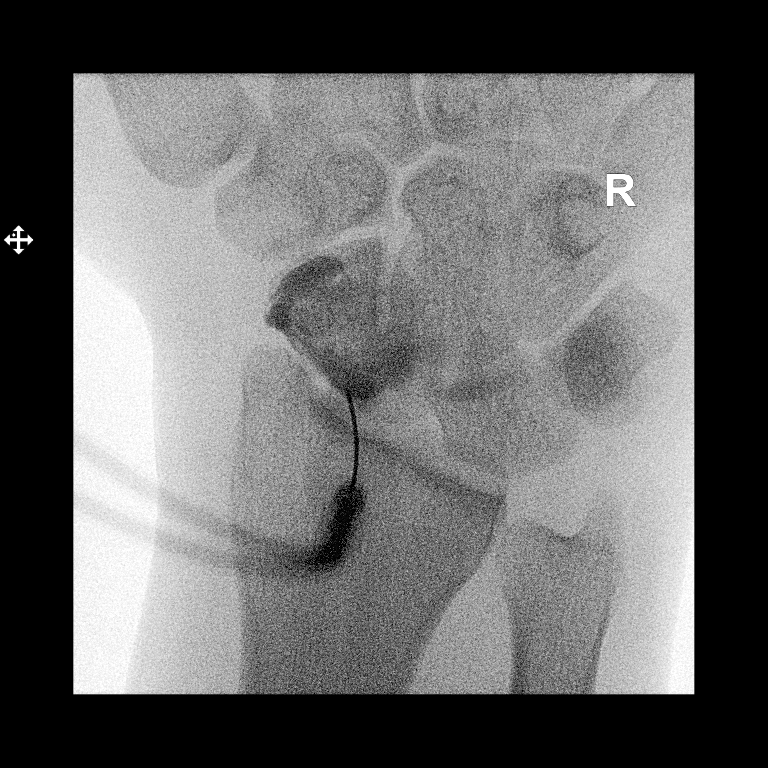

[Series 2: ortho standard · 1 of 1 slices shown (2 of 2)]
[im 1/1]
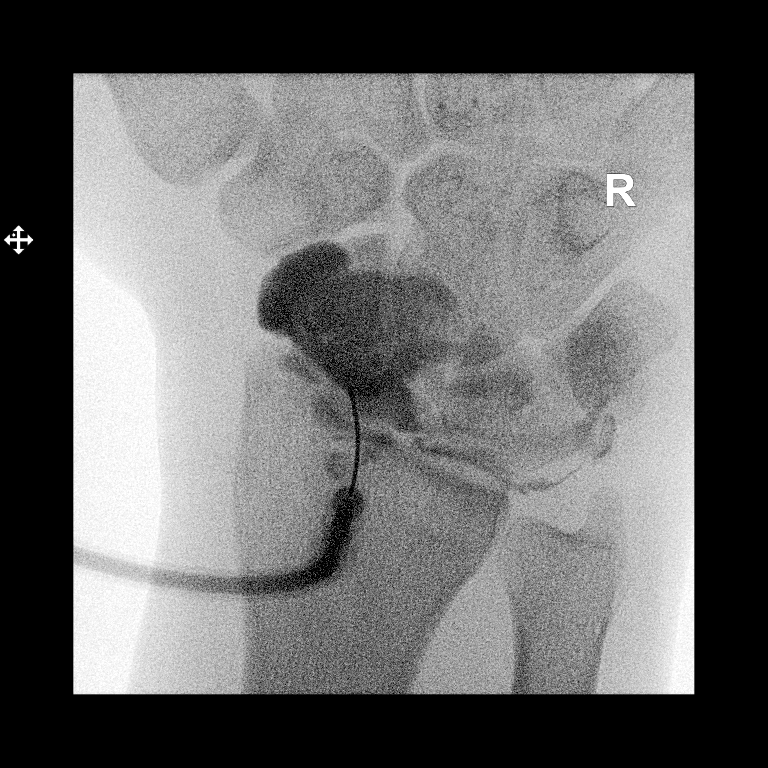

[2 of 2 positions shown; findings below may reference images not displayed]

FLUOROSCOPY TIME:  Radiation Exposure Index (as provided by the
fluoroscopic device): 0.48 ?Gy*m2

Fluoroscopy Time:  10 seconds.

Number of Acquired Images:  0

PROCEDURE:
RIGHT WRIST INJECTION UNDER FLUOROSCOPY

An appropriate skin entrance site was determined. The site was
marked, prepped with Betadine, draped in the usual sterile fashion,
and infiltrated locally with 1% Lidocaine. A 25 gauge skin needle
was advanced into the radiocarpal joint under intermittent
fluoroscopy. 3 ml of a mixture of 0.1 ml Multihance, 15 ml of Isovue
200, and 5 ml of 1% lidocaine was then used to opacify the proximal
carpal joint. No immediate complication.
IMPRESSION: Technically successful right wrist injection for MRI.

## 2021-08-27 DIAGNOSIS — M25511 Pain in right shoulder: Secondary | ICD-10-CM | POA: Insufficient documentation

## 2022-11-13 DIAGNOSIS — S39012A Strain of muscle, fascia and tendon of lower back, initial encounter: Secondary | ICD-10-CM | POA: Insufficient documentation

## 2023-09-01 ENCOUNTER — Ambulatory Visit: Admitting: Family Medicine

## 2023-12-14 ENCOUNTER — Ambulatory Visit: Admitting: Family Medicine

## 2024-03-23 ENCOUNTER — Encounter: Payer: Self-pay | Admitting: Family Medicine

## 2024-03-23 ENCOUNTER — Ambulatory Visit: Admitting: Family Medicine

## 2024-03-23 VITALS — BP 126/84 | HR 101 | Ht 67.75 in | Wt 213.0 lb

## 2024-03-23 DIAGNOSIS — J301 Allergic rhinitis due to pollen: Secondary | ICD-10-CM

## 2024-03-23 DIAGNOSIS — Z1159 Encounter for screening for other viral diseases: Secondary | ICD-10-CM

## 2024-03-23 DIAGNOSIS — J4520 Mild intermittent asthma, uncomplicated: Secondary | ICD-10-CM | POA: Diagnosis not present

## 2024-03-23 DIAGNOSIS — E66811 Obesity, class 1: Secondary | ICD-10-CM

## 2024-03-23 DIAGNOSIS — Z0001 Encounter for general adult medical examination with abnormal findings: Secondary | ICD-10-CM | POA: Diagnosis not present

## 2024-03-23 DIAGNOSIS — Z114 Encounter for screening for human immunodeficiency virus [HIV]: Secondary | ICD-10-CM

## 2024-03-23 DIAGNOSIS — Z0000 Encounter for general adult medical examination without abnormal findings: Secondary | ICD-10-CM | POA: Insufficient documentation

## 2024-03-23 DIAGNOSIS — Z6832 Body mass index (BMI) 32.0-32.9, adult: Secondary | ICD-10-CM

## 2024-03-23 DIAGNOSIS — Z131 Encounter for screening for diabetes mellitus: Secondary | ICD-10-CM

## 2024-03-23 DIAGNOSIS — E6609 Other obesity due to excess calories: Secondary | ICD-10-CM | POA: Insufficient documentation

## 2024-03-23 DIAGNOSIS — Z13220 Encounter for screening for lipoid disorders: Secondary | ICD-10-CM

## 2024-03-23 DIAGNOSIS — Z1329 Encounter for screening for other suspected endocrine disorder: Secondary | ICD-10-CM
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
## 2024-03-24 ENCOUNTER — Ambulatory Visit: Payer: Self-pay | Admitting: Family Medicine

## 2024-03-24 DIAGNOSIS — R7401 Elevation of levels of liver transaminase levels: Secondary | ICD-10-CM

## 2024-03-25 LAB — COMPREHENSIVE METABOLIC PANEL WITH GFR
AG Ratio: 1.7 (calc) (ref 1.0–2.5)
ALT: 117 U/L — ABNORMAL HIGH (ref 9–46)
AST: 40 U/L (ref 10–40)
Albumin: 4.8 g/dL (ref 3.6–5.1)
Alkaline phosphatase (APISO): 60 U/L (ref 36–130)
BUN: 18 mg/dL (ref 7–25)
CO2: 27 mmol/L (ref 20–32)
Calcium: 9.2 mg/dL (ref 8.6–10.3)
Chloride: 103 mmol/L (ref 98–110)
Creat: 1.12 mg/dL (ref 0.60–1.24)
Globulin: 2.9 g/dL (ref 1.9–3.7)
Glucose, Bld: 96 mg/dL (ref 65–99)
Potassium: 4.1 mmol/L (ref 3.5–5.3)
Sodium: 138 mmol/L (ref 135–146)
Total Bilirubin: 0.5 mg/dL (ref 0.2–1.2)
Total Protein: 7.7 g/dL (ref 6.1–8.1)
eGFR: 91 mL/min/{1.73_m2}

## 2024-03-25 LAB — TEST AUTHORIZATION

## 2024-03-25 LAB — CBC WITH DIFFERENTIAL/PLATELET
Absolute Lymphocytes: 3666 {cells}/uL (ref 850–3900)
Absolute Monocytes: 555 {cells}/uL (ref 200–950)
Basophils Absolute: 66 {cells}/uL (ref 0–200)
Basophils Relative: 0.7 %
Eosinophils Absolute: 207 {cells}/uL (ref 15–500)
Eosinophils Relative: 2.2 %
HCT: 46.4 % (ref 39.4–51.1)
Hemoglobin: 15.2 g/dL (ref 13.2–17.1)
MCH: 29.1 pg (ref 27.0–33.0)
MCHC: 32.8 g/dL (ref 31.6–35.4)
MCV: 88.9 fL (ref 81.4–101.7)
MPV: 12.7 fL — ABNORMAL HIGH (ref 7.5–12.5)
Monocytes Relative: 5.9 %
Neutro Abs: 4907 {cells}/uL (ref 1500–7800)
Neutrophils Relative %: 52.2 %
Platelets: 187 10*3/uL (ref 140–400)
RBC: 5.22 Million/uL (ref 4.20–5.80)
RDW: 13.1 % (ref 11.0–15.0)
Total Lymphocyte: 39 %
WBC: 9.4 10*3/uL (ref 3.8–10.8)

## 2024-03-25 LAB — TSH: TSH: 3.06 m[IU]/L (ref 0.40–4.50)

## 2024-03-25 LAB — LIPID PANEL
Cholesterol: 191 mg/dL
HDL: 44 mg/dL
LDL Cholesterol (Calc): 115 mg/dL — ABNORMAL HIGH
Non-HDL Cholesterol (Calc): 147 mg/dL — ABNORMAL HIGH
Total CHOL/HDL Ratio: 4.3 (calc)
Triglycerides: 204 mg/dL — ABNORMAL HIGH

## 2024-03-25 LAB — HEMOGLOBIN A1C
Hgb A1c MFr Bld: 5.5 %
Mean Plasma Glucose: 111 mg/dL
eAG (mmol/L): 6.2 mmol/L

## 2024-03-25 LAB — HEPATITIS C ANTIBODY: Hepatitis C Ab: NONREACTIVE

## 2024-03-25 LAB — HEPATITIS A ANTIBODY, TOTAL: Hepatitis A AB,Total: REACTIVE — AB

## 2024-03-25 LAB — HIV ANTIBODY (ROUTINE TESTING W REFLEX)
HIV 1&2 Ab, 4th Generation: NONREACTIVE
HIV FINAL INTERPRETATION: NEGATIVE

## 2024-03-25 LAB — HEPATITIS B CORE ANTIBODY, TOTAL, WITH REFLEX TO IGM: Hep B Core Total Ab: NONREACTIVE

## 2025-03-20 ENCOUNTER — Other Ambulatory Visit

## 2025-03-27 ENCOUNTER — Encounter: Admitting: Family Medicine
